# Patient Record
Sex: Female | Born: 2000 | Race: White | Hispanic: No | Marital: Single
Health system: Southern US, Community
[De-identification: ages and names within clinical notes are randomized; demographics above are authoritative.]

## PROBLEM LIST (undated history)

## (undated) DIAGNOSIS — J309 Allergic rhinitis, unspecified: Secondary | ICD-10-CM

## (undated) DIAGNOSIS — E109 Type 1 diabetes mellitus without complications: Secondary | ICD-10-CM

## (undated) DIAGNOSIS — K900 Celiac disease: Secondary | ICD-10-CM

## (undated) DIAGNOSIS — K219 Gastro-esophageal reflux disease without esophagitis: Secondary | ICD-10-CM

## (undated) DIAGNOSIS — R519 Headache, unspecified: Secondary | ICD-10-CM

## (undated) DIAGNOSIS — F909 Attention-deficit hyperactivity disorder, unspecified type: Secondary | ICD-10-CM

## (undated) DIAGNOSIS — J45909 Unspecified asthma, uncomplicated: Secondary | ICD-10-CM

## (undated) HISTORY — PX: DENTAL SURGERY: SHX609

## (undated) HISTORY — DX: Celiac disease: K90.0

## (undated) HISTORY — DX: Gastro-esophageal reflux disease without esophagitis: K21.9

## (undated) HISTORY — DX: Type 1 diabetes mellitus without complications: E10.9

## (undated) HISTORY — DX: Unspecified asthma, uncomplicated: J45.909

## (undated) HISTORY — DX: Headache, unspecified: R51.9

## (undated) HISTORY — DX: Attention-deficit hyperactivity disorder, unspecified type: F90.9

## (undated) HISTORY — DX: Allergic rhinitis, unspecified: J30.9

---
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
## 2019-09-09 ENCOUNTER — Encounter: Payer: Self-pay | Admitting: Internal Medicine

## 2019-09-12 ENCOUNTER — Ambulatory Visit: Payer: Self-pay | Admitting: Internal Medicine

## 2019-10-17 ENCOUNTER — Ambulatory Visit (INDEPENDENT_AMBULATORY_CARE_PROVIDER_SITE_OTHER): Payer: Commercial Managed Care - PPO | Admitting: Internal Medicine

## 2019-10-17 ENCOUNTER — Encounter: Payer: Self-pay | Admitting: Internal Medicine

## 2019-10-17 DIAGNOSIS — G4700 Insomnia, unspecified: Secondary | ICD-10-CM

## 2019-10-17 DIAGNOSIS — Z7689 Persons encountering health services in other specified circumstances: Secondary | ICD-10-CM | POA: Diagnosis not present

## 2019-10-17 DIAGNOSIS — J4520 Mild intermittent asthma, uncomplicated: Secondary | ICD-10-CM | POA: Diagnosis not present

## 2019-10-17 MED ORDER — TRAZODONE HCL 50 MG PO TABS: 25.0000 mg | ORAL_TABLET | Freq: Every evening | ORAL | 3 refills | Status: DC | PRN

## 2019-10-17 MED ORDER — ALBUTEROL SULFATE HFA 108 (90 BASE) MCG/ACT IN AERS: 2.0000 | INHALATION_SPRAY | RESPIRATORY_TRACT | 5 refills | Status: DC | PRN

## 2019-10-17 NOTE — Progress Notes (Signed)
Virtual Visit via Telephone Note  I connected with Samantha Bernard, on 10/17/2019 at 8:52 AM by telephone due to the COVID-19 pandemic and verified that I am speaking with the correct person using two identifiers.   Consent: I discussed the limitations, risks, security and privacy concerns of performing an evaluation and management service by telephone and the availability of in person appointments. I also discussed with the patient that there may be a patient responsible charge related to this service. The patient expressed understanding and agreed to proceed.   Location of Patient: Home   Location of Provider: Clinic    Persons participating in Telemedicine visit: Tarra Pence [facility] Dr. Juleen China      History of Present Illness: Patient has a visit to establish care. Patient has a PMH of asthma. She has been out of her Albuterol because she did not have a primary care doctor. She was previously on an inhaler that she used daily but has been 4-5 years. Otherwise, just had rescue inhaler. She has been out of Albuterol for about 1.5 months. Reports her symptoms are fine at rest and with day to day tasks. Typically, uses inhaler at most once per day. Has noticed it worsening a little bit more with the season change. No nighttime awakenings. She has had an asthma exacerbation in the past and it doesn't feel like she is having one now. Has had to be hospitalized for asthma. Has never been intubated.   Patient is also T1DM. She is managed by Jacolyn Reedy with [phone] Endocrinology.   Patient has concerns about insomnia. Not typically falling asleep until 3-4 am and if she doesn't have to wake up to go to work she will sleep until noon or 1 pm. She doesn't feel tired at night when she tries to go to bed. Asks about medicine to help with sleeping as she is starting school again the first week of May and worried about her sleep pattern.    Past Medical History:  Diagnosis Date  . ADHD    . Allergic rhinitis   . Asthma   . Celiac disease   . GERD (gastroesophageal reflux disease)   . Type 1 diabetes mellitus (HCC)    No Known Allergies  Current Outpatient Medications on File Prior to Visit  Medication Sig Dispense Refill  . albuterol (VENTOLIN HFA) 108 (90 Base) MCG/ACT inhaler Inhale 2 puffs into the lungs every 4 (four) hours as needed for wheezing or shortness of breath.    . Continuous Blood Gluc Receiver ([place]) DEVI 1 each by Misc.(Non-Drug; Combo Route) route daily.    . Continuous Blood Gluc Sensor (DEXCOM G6 SENSOR) MISC 1 each by Misc.(Non-Drug; Combo Route) route every 10 days.    . Continuous Blood Gluc Transmit (DEXCOM G6 TRANSMITTER) MISC 1 each by Misc.(Non-Drug; Combo Route) route every 3 months.    . injection device for insulin (INPEN 100-BLUE-LILLY) DEVI Use as directed to administer insulin 4 times daily    . insulin degludec (TRESIBA FLEXTOUCH) 100 UNIT/ML SOPN FlexTouch Pen Inject 55 Units into the skin daily.    . insulin lispro (HUMALOG) 100 UNIT/ML cartridge Use to inject up to 50 units daily and as needed    . norelgestromin-ethinyl estradiol [name]) 150-35 MCG/24HR transdermal patch Place 1 patch onto the skin once a week.     No current facility-administered medications on file prior to visit.    Observations/Objective: NAD. Speaking clearly.  Work of breathing normal.  Alert and oriented.  Mood appropriate.   Assessment and Plan: 1. Encounter to establish care   2. Mild intermittent asthma without complication No concern for exacerbation. Per history, does not sound like patient is using inhaler frequently enough or having symptomatology that would warrant daily control inhaler.  - albuterol (VENTOLIN HFA) 108 (90 Base) MCG/ACT inhaler; Inhale 2 puffs into the lungs every 4 (four) hours as needed for wheezing or shortness of breath.  Dispense: 18 g; Refill: 5  3. Insomnia, unspecified type Suspect related to disrupted  sleep pattern. Advised to try to wake up earlier in AM to break pattern of sleeping until mid-day. Counseled on sleep hygiene. Will trial Trazodone to help reset sleep cycle given upcoming school semester.  - traZODone (DESYREL) 50 MG tablet; Take 0.5-1 tablets (25-50 mg total) by mouth at bedtime as needed for sleep.  Dispense: 30 tablet; Refill: 3   Follow Up Instructions: PRN for above and for annual exam    I discussed the assessment and treatment plan with the patient. The patient was provided an opportunity to ask questions and all were answered. The patient agreed with the plan and demonstrated an understanding of the instructions.   The patient was advised to call back or seek an in-person evaluation if the symptoms worsen or if the condition fails to improve as anticipated.     I provided 18 minutes total of non-face-to-face time during this encounter including median intraservice time, reviewing previous notes, investigations, ordering medications, medical decision making, coordinating care and patient verbalized understanding at the end of the visit.    Phill Myron, D.O. Primary Care at [facility]  10/17/2019, 8:52 AM

## 2020-02-10 ENCOUNTER — Encounter: Payer: Self-pay | Admitting: Emergency Medicine

## 2020-02-10 ENCOUNTER — Ambulatory Visit
Admission: EM | Admit: 2020-02-10 | Discharge: 2020-02-10 | Disposition: A | Discharge status: Home / Self Care | Payer: Commercial Managed Care - PPO | Attending: Physician Assistant | Admitting: Physician Assistant

## 2020-02-10 ENCOUNTER — Other Ambulatory Visit: Payer: Self-pay

## 2020-02-10 DIAGNOSIS — R112 Nausea with vomiting, unspecified: Secondary | ICD-10-CM

## 2020-02-10 MED ORDER — ONDANSETRON 4 MG PO TBDP
4.0000 mg | ORAL_TABLET | Freq: Once | ORAL | Status: AC
  Administered 2020-02-10: 4 mg via ORAL

## 2020-02-10 NOTE — ED Notes (Signed)
Patient able to ambulate independently  

## 2020-02-10 NOTE — ED Triage Notes (Signed)
Patient presents to [facility] for assessment of nausea x 5 days, emesis 2 times since symptom onset.  Type 1 diabetic, but sugars have been stable, has monitor in arm.  Denies abdominal pain, states she has been able to eat and drink small amounts.

## 2020-02-10 NOTE — ED Provider Notes (Signed)
[facility]    CSN: [id number] Arrival date & time: 02/10/20  1507      History   Chief Complaint Chief Complaint  Patient presents with  . Nausea    HPI Samantha Bernard is a 19 y.o. female.   19 year old female with IDDM comes in for 5-day history of nausea.  2 episodes of nonbilious nonbloody vomit, has since been able to tolerate oral intake.  Denies abdominal pain, diarrhea, fever.  Denies URI symptoms.  CBG 149 at this time.      Past Medical History:  Diagnosis Date  . ADHD   . Allergic rhinitis   . Asthma   . Celiac disease   . GERD (gastroesophageal reflux disease)   . Type 1 diabetes mellitus [facility])     Patient Active Problem List   Diagnosis Date Noted  . Type 1 diabetes mellitus ([place]) 03/23/2016  . Allergic rhinitis 04/14/2011  . Asthma 04/14/2011  . Attention deficit hyperactivity disorder (ADHD) 04/14/2011  . Gastroesophageal reflux disease 04/14/2011    Past Surgical History:  Procedure Laterality Date  . DENTAL SURGERY      OB History   No obstetric history on file.      Home Medications    Prior to Admission medications   Medication Sig Start Date End Date Taking? Authorizing Provider  albuterol (VENTOLIN HFA) 108 (90 Base) MCG/ACT inhaler Inhale 2 puffs into the lungs every 4 (four) hours as needed for wheezing or shortness of breath. 10/17/19   Nicolette Bang, DO  Continuous Blood Gluc Receiver ([place]) DEVI 1 each by Misc.(Non-Drug; Combo Route) route daily. 10/05/18   [provider]  Continuous Blood Gluc Sensor (DEXCOM G6 SENSOR) MISC 1 each by Misc.(Non-Drug; Combo Route) route every 10 days. 10/05/18   [provider]  Continuous Blood Gluc Transmit (DEXCOM G6 TRANSMITTER) MISC 1 each by Misc.(Non-Drug; Combo Route) route every 3 months. 05/02/19   [provider]  injection device for insulin (INPEN 100-BLUE-LILLY) DEVI Use as directed to administer insulin 4 times daily  04/26/19   [provider]  insulin degludec (TRESIBA FLEXTOUCH) 100 UNIT/ML SOPN FlexTouch Pen Inject 55 Units into the skin daily. 08/06/19   [provider]  insulin lispro (HUMALOG) 100 UNIT/ML cartridge Use to inject up to 50 units daily and as needed 08/06/19   [provider]  norelgestromin-ethinyl estradiol [name]) 150-35 MCG/24HR transdermal patch Place 1 patch onto the skin once a week. 06/12/19   [provider]  traZODone (DESYREL) 50 MG tablet Take 0.5-1 tablets (25-50 mg total) by mouth at bedtime as needed for sleep. 10/17/19   Nicolette Bang, DO    Family History Family History  Problem Relation Age of Onset  . Diabetes Mellitus I Mother   . Thyroid disease Mother   . Depression Mother   . Asthma Mother   . Arthritis Mother   . Arthritis Maternal Grandmother   . Cancer Maternal Grandmother   . Heart disease Maternal Grandmother   . Hypertension Maternal Grandmother   . Arthritis Maternal Grandfather   . Cancer Maternal Grandfather   . Heart disease Maternal Grandfather   . Hypertension Maternal Grandfather   . Stroke Maternal Grandfather     Social History Social History   Tobacco Use  . Smoking status: Passive Smoke Exposure - Never Smoker  . Smokeless tobacco: Never Used  Substance Use Topics  . Alcohol use: Never  . Drug use: Not on file  Allergies   Patient has no known allergies.   Review of Systems Review of Systems  Reason unable to perform ROS: See HPI as above.     Physical Exam Triage Vital Signs ED Triage Vitals [02/10/20 1613]  Enc Vitals Group     BP 107/75     Pulse Rate 97     Resp 16     Temp 98.2 F (36.8 C)     Temp Source Oral     SpO2 100 %     Weight      Height      Head Circumference      Peak Flow      Pain Score      Pain Loc      Pain Edu?      Excl. in [place]?    Orthostatic VS for the past 24 hrs:  BP- Lying Pulse- Lying BP- Sitting Pulse- Sitting BP- Standing  at 0 minutes Pulse- Standing at 0 minutes  02/10/20 1637 119/76 79 114/78 92 119/81 101    Updated Vital Signs BP 107/75 (BP Location: Left Arm)   Pulse 97   Temp 98.2 F (36.8 C) (Oral)   Resp 16   LMP 02/04/2020 Comment: patch  SpO2 100%   Physical Exam Constitutional:      General: She is not in acute distress.    Appearance: She is well-developed. She is not ill-appearing, toxic-appearing or diaphoretic.  HENT:     Head: Normocephalic and atraumatic.  Eyes:     Conjunctiva/sclera: Conjunctivae normal.     Pupils: Pupils are equal, round, and reactive to light.  Cardiovascular:     Rate and Rhythm: Normal rate and regular rhythm.  Pulmonary:     Effort: Pulmonary effort is normal. No respiratory distress.     Comments: LCTAB Abdominal:     General: Bowel sounds are normal.     Palpations: Abdomen is soft.     Tenderness: There is no abdominal tenderness. There is no right CVA tenderness, left CVA tenderness, guarding or rebound.  Musculoskeletal:     Cervical back: Normal range of motion and neck supple.  Skin:    General: Skin is warm and dry.  Neurological:     Mental Status: She is alert and oriented to person, place, and time.  Psychiatric:        Behavior: Behavior normal.        Judgment: Judgment normal.      UC Treatments / Results  Labs (all labs ordered are listed, but only abnormal results are displayed) Labs Reviewed - No data to display  EKG   Radiology No results found.  Procedures Procedures (including critical care time)  Medications Ordered in UC Medications  ondansetron (ZOFRAN-ODT) disintegrating tablet 4 mg (4 mg Oral Given 02/10/20 1637)    Initial Impression / Assessment and Plan / UC Course  I have reviewed the triage vital signs and the nursing notes.  Pertinent labs & imaging results that were available during my care of the patient were reviewed by me and considered in my medical decision making (see chart for details).     Discussed with patient no alarming signs on exam. Patient with Rx of phenergan, to continue for nausea. Push fluids. Bland diet, advance as tolerated. Return precautions given.  Final Clinical Impressions(s) / UC Diagnoses   Final diagnoses:  Intractable vomiting with nausea, unspecified vomiting type    ED Prescriptions    None     PDMP  not reviewed this encounter.   Ok Edwards, PA-C 02/10/20 2020

## 2020-02-10 NOTE — Discharge Instructions (Addendum)
Your heart rate elevated slightly but no worries for significant dehydration requiring IV fluids at this time. Given blood sugar in good control, low suspicion for your diabetes causing symptoms. Can continue phenergan for now and continue to stay hydrated. You can also try some acid reflux medicine such as pepcid/omeprazole to see if it helps with symptoms. If symptoms worsens, go to the ED for further evaluation. Otherwise, follow up with PCP even if your symptoms improve for further evaluation and management needed.

## 2020-02-13 ENCOUNTER — Telehealth (INDEPENDENT_AMBULATORY_CARE_PROVIDER_SITE_OTHER): Payer: Commercial Managed Care - PPO | Admitting: Internal Medicine

## 2020-02-13 ENCOUNTER — Encounter: Payer: Self-pay | Admitting: Internal Medicine

## 2020-02-13 DIAGNOSIS — R112 Nausea with vomiting, unspecified: Secondary | ICD-10-CM | POA: Diagnosis not present

## 2020-02-13 DIAGNOSIS — G43809 Other migraine, not intractable, without status migrainosus: Secondary | ICD-10-CM

## 2020-02-13 MED ORDER — CYCLOBENZAPRINE HCL 10 MG PO TABS: 10.0000 mg | ORAL_TABLET | Freq: Three times a day (TID) | ORAL | 1 refills | Status: DC | PRN

## 2020-02-13 MED ORDER — SUMATRIPTAN SUCCINATE 50 MG PO TABS: 50.0000 mg | ORAL_TABLET | ORAL | 1 refills | Status: DC | PRN

## 2020-02-13 NOTE — Progress Notes (Signed)
Virtual Visit via Telephone Note  I connected with Samantha Bernard, on 02/13/2020 at 4:11 PM by telephone due to the COVID-19 pandemic and verified that I am speaking with the correct person using two identifiers.   Consent: I discussed the limitations, risks, security and privacy concerns of performing an evaluation and management service by telephone and the availability of in person appointments. I also discussed with the patient that there may be a patient responsible charge related to this service. The patient expressed understanding and agreed to proceed.   Location of Patient: Home   Location of Provider: Clinic    Persons participating in Telemedicine visit: Purva Vessell [facility] Dr. Juleen China   History of Present Illness: Patient has a visit to follow up on urgent care visit 7/19 for 5 days of nausea with a couple episodes of vomiting. Patient reports that nausea is almost all gone but she doesn't feel 100% back to normal. Had to take Phenergan once the day after the urgent care. Has a history of migraines and has had a migraine since yesterday. Taking Ibuprofen at home without improvement.    Past Medical History:  Diagnosis Date  . ADHD   . Allergic rhinitis   . Asthma   . Celiac disease   . GERD (gastroesophageal reflux disease)   . Type 1 diabetes mellitus (HCC)    No Known Allergies  Current Outpatient Medications on File Prior to Visit  Medication Sig Dispense Refill  . albuterol (VENTOLIN HFA) 108 (90 Base) MCG/ACT inhaler Inhale 2 puffs into the lungs every 4 (four) hours as needed for wheezing or shortness of breath. 18 g 5  . Continuous Blood Gluc Receiver ([place]) DEVI 1 each by Misc.(Non-Drug; Combo Route) route daily.    . Continuous Blood Gluc Sensor (DEXCOM G6 SENSOR) MISC 1 each by Misc.(Non-Drug; Combo Route) route every 10 days.    . Continuous Blood Gluc Transmit (DEXCOM G6 TRANSMITTER) MISC 1 each by Misc.(Non-Drug; Combo Route)  route every 3 months.    . injection device for insulin (INPEN 100-BLUE-LILLY) DEVI Use as directed to administer insulin 4 times daily    . insulin degludec (TRESIBA FLEXTOUCH) 100 UNIT/ML SOPN FlexTouch Pen Inject 55 Units into the skin daily.    . insulin lispro (HUMALOG) 100 UNIT/ML cartridge Use to inject up to 50 units daily and as needed    . norelgestromin-ethinyl estradiol [name]) 150-35 MCG/24HR transdermal patch Place 1 patch onto the skin once a week.    . traZODone (DESYREL) 50 MG tablet Take 0.5-1 tablets (25-50 mg total) by mouth at bedtime as needed for sleep. 30 tablet 3   No current facility-administered medications on file prior to visit.    Observations/Objective: NAD. Speaking clearly.  Work of breathing normal.  Alert and oriented. Mood appropriate.   Assessment and Plan: 1. Nausea and vomiting, intractability of vomiting not specified, unspecified vomiting type Improving. Continue bland diet, rest, hydration.   2. Other migraine without status migrainosus, not intractable Patient has taken muscle relaxer with improvement in headache pattern in the past but has been sedating and has concerns about taking during daytime. Will prescribe this but also Imitrex for abortive migraine therapy.  - cyclobenzaprine (FLEXERIL) 10 MG tablet; Take 1 tablet (10 mg total) by mouth 3 (three) times daily as needed.  Dispense: 30 tablet; Refill: 1 - SUMAtriptan (IMITREX) 50 MG tablet; Take 1 tablet (50 mg total) by mouth every 2 (two) hours as needed for migraine. May repeat in  2 hours if headache persists or recurs.  Dispense: 10 tablet; Refill: 1   Follow Up Instructions: PRN and for routine medical care    I discussed the assessment and treatment plan with the patient. The patient was provided an opportunity to ask questions and all were answered. The patient agreed with the plan and demonstrated an understanding of the instructions.   The patient was advised to call back or  seek an in-person evaluation if the symptoms worsen or if the condition fails to improve as anticipated.     I provided 20 minutes total of non-face-to-face time during this encounter including median intraservice time, reviewing previous notes, investigations, ordering medications, medical decision making, coordinating care and patient verbalized understanding at the end of the visit.    Phill Myron, D.O. Primary Care at [facility]  02/13/2020, 4:11 PM

## 2020-02-25 ENCOUNTER — Other Ambulatory Visit: Payer: Self-pay

## 2020-02-25 DIAGNOSIS — G4700 Insomnia, unspecified: Secondary | ICD-10-CM

## 2020-02-25 MED ORDER — TRAZODONE HCL 50 MG PO TABS: 25.0000 mg | ORAL_TABLET | Freq: Every evening | ORAL | 0 refills | Status: DC | PRN

## 2020-03-12 ENCOUNTER — Encounter: Payer: Self-pay | Admitting: Internal Medicine

## 2020-03-12 ENCOUNTER — Telehealth (INDEPENDENT_AMBULATORY_CARE_PROVIDER_SITE_OTHER): Payer: Commercial Managed Care - PPO | Admitting: Internal Medicine

## 2020-03-12 DIAGNOSIS — R112 Nausea with vomiting, unspecified: Secondary | ICD-10-CM | POA: Diagnosis not present

## 2020-03-12 DIAGNOSIS — R1084 Generalized abdominal pain: Secondary | ICD-10-CM

## 2020-03-12 MED ORDER — PANTOPRAZOLE SODIUM 40 MG PO TBEC: 40.0000 mg | DELAYED_RELEASE_TABLET | Freq: Every day | ORAL | 1 refills | Status: DC

## 2020-03-12 MED ORDER — ONDANSETRON HCL 4 MG PO TABS: 4.0000 mg | ORAL_TABLET | Freq: Three times a day (TID) | ORAL | 1 refills | Status: AC | PRN

## 2020-03-12 NOTE — Progress Notes (Signed)
Virtual Visit via Telephone Note  I connected with Samantha Bernard, on 03/12/2020 at 9:01 AM by telephone due to the COVID-19 pandemic and verified that I am speaking with the correct person using two identifiers.   Consent: I discussed the limitations, risks, security and privacy concerns of performing an evaluation and management service by telephone and the availability of in person appointments. I also discussed with the patient that there may be a patient responsible charge related to this service. The patient expressed understanding and agreed to proceed.   Location of Patient: Home   Location of Provider: Clinic    Persons participating in Telemedicine visit: Aviyah Swetz [facility] Dr. Juleen China      History of Present Illness: Patient has a visit to follow up on nausea, vomiting, abdominal pain. She was seen in Urgent Care for same complaint on 7/19. Had televisit 7/22 and was improving; thought to be related to acute gastroenteritis and dehydration. Started again on Tuesday this week and had to leave school/work due to it. Had a prior episode between our last visit and the one this week but wasn't as severe. Abdominal pain is generalized and not localized. She is feeling nauseous but has not been able to actually vomit. She has a history of T1DM but CBGs have not been low or high. Also has a history of migraines but reports this does not feel related. Notices that sometimes it happens after eating and sometimes happens before eating and will try to eat to improve symptoms but won't help. Denies burning epigastric pain. Does feel like sometimes she has to burp but will throw up if she does. No diarrhea or constipation. No blood in stool. No THC use. Reports that she was diagnosed with celiac disease a long time ago but she tried to go back on gluten free diet and didn't change her symptoms.    Past Medical History:  Diagnosis Date  . ADHD   . Allergic rhinitis   . Asthma    . Celiac disease   . GERD (gastroesophageal reflux disease)   . Type 1 diabetes mellitus (HCC)    No Known Allergies  Current Outpatient Medications on File Prior to Visit  Medication Sig Dispense Refill  . albuterol (VENTOLIN HFA) 108 (90 Base) MCG/ACT inhaler Inhale 2 puffs into the lungs every 4 (four) hours as needed for wheezing or shortness of breath. 18 g 5  . Continuous Blood Gluc Receiver ([place]) DEVI 1 each by Misc.(Non-Drug; Combo Route) route daily.    . Continuous Blood Gluc Sensor (DEXCOM G6 SENSOR) MISC 1 each by Misc.(Non-Drug; Combo Route) route every 10 days.    . Continuous Blood Gluc Transmit (DEXCOM G6 TRANSMITTER) MISC 1 each by Misc.(Non-Drug; Combo Route) route every 3 months.    . cyclobenzaprine (FLEXERIL) 10 MG tablet Take 1 tablet (10 mg total) by mouth 3 (three) times daily as needed. 30 tablet 1  . injection device for insulin (INPEN 100-BLUE-LILLY) DEVI Use as directed to administer insulin 4 times daily    . insulin degludec (TRESIBA FLEXTOUCH) 100 UNIT/ML SOPN FlexTouch Pen Inject 55 Units into the skin daily.    . insulin lispro (HUMALOG) 100 UNIT/ML cartridge Use to inject up to 50 units daily and as needed    . norelgestromin-ethinyl estradiol [name]) 150-35 MCG/24HR transdermal patch Place 1 patch onto the skin once a week.    . SUMAtriptan (IMITREX) 50 MG tablet Take 1 tablet (50 mg total) by mouth every 2 (two)  hours as needed for migraine. May repeat in 2 hours if headache persists or recurs. 10 tablet 1  . traZODone (DESYREL) 50 MG tablet Take 0.5-1 tablets (25-50 mg total) by mouth at bedtime as needed for sleep. 90 tablet 0   No current facility-administered medications on file prior to visit.    Observations/Objective: NAD. Speaking clearly.  Work of breathing normal.  Alert and oriented. Mood appropriate.   Assessment and Plan: 1. Generalized abdominal pain 2. Nausea and vomiting, intractability of vomiting not specified,  unspecified vomiting type Chronic and recurrent in nature. Patient does report h/o celiac disease. Would advise gluten avoidance. Will trial PPI to see if symptoms may be related to uncontrolled reflux. Will also refer to GI for further work up.  - pantoprazole (PROTONIX) 40 MG tablet; Take 1 tablet (40 mg total) by mouth daily.  Dispense: 30 tablet; Refill: 1 - Ambulatory referral to Gastroenterology - ondansetron (ZOFRAN) 4 MG tablet; Take 1 tablet (4 mg total) by mouth every 8 (eight) hours as needed for nausea or vomiting.  Dispense: 30 tablet; Refill: 1     Follow Up Instructions: GI follow up and with PCP for routine medical care    I discussed the assessment and treatment plan with the patient. The patient was provided an opportunity to ask questions and all were answered. The patient agreed with the plan and demonstrated an understanding of the instructions.   The patient was advised to call back or seek an in-person evaluation if the symptoms worsen or if the condition fails to improve as anticipated.     I provided 18 minutes total of non-face-to-face time during this encounter including median intraservice time, reviewing previous notes, investigations, ordering medications, medical decision making, coordinating care and patient verbalized understanding at the end of the visit.    Phill Myron, D.O. Primary Care at [facility]  03/12/2020, 9:01 AM

## 2020-04-03 ENCOUNTER — Other Ambulatory Visit: Payer: Self-pay

## 2020-04-03 DIAGNOSIS — R112 Nausea with vomiting, unspecified: Secondary | ICD-10-CM

## 2020-04-03 DIAGNOSIS — R1084 Generalized abdominal pain: Secondary | ICD-10-CM

## 2020-04-03 MED ORDER — PANTOPRAZOLE SODIUM 40 MG PO TBEC: 40.0000 mg | DELAYED_RELEASE_TABLET | Freq: Every day | ORAL | 0 refills | Status: AC

## 2020-04-15 ENCOUNTER — Telehealth (INDEPENDENT_AMBULATORY_CARE_PROVIDER_SITE_OTHER): Payer: Commercial Managed Care - PPO | Admitting: Family Medicine

## 2020-04-15 DIAGNOSIS — R519 Headache, unspecified: Secondary | ICD-10-CM

## 2020-04-15 DIAGNOSIS — Z789 Other specified health status: Secondary | ICD-10-CM | POA: Diagnosis not present

## 2020-04-15 MED ORDER — METOCLOPRAMIDE HCL 10 MG PO TABS: 10.0000 mg | ORAL_TABLET | Freq: Three times a day (TID) | ORAL | 1 refills | Status: AC | PRN

## 2020-04-15 MED ORDER — TIZANIDINE HCL 2 MG PO CAPS: 2.0000 mg | ORAL_CAPSULE | Freq: Three times a day (TID) | ORAL | 1 refills | Status: AC | PRN

## 2020-04-15 NOTE — Progress Notes (Signed)
Virtual Visit via Telephone Note  I connected with Samantha Bernard on 04/15/20 at  3:10 PM EDT by telephone and verified that I am speaking with the correct person using two identifiers.  Location: Patient: Home  Provider: Office    I discussed the limitations, risks, security and privacy concerns of performing an evaluation and management service by telephone and the availability of in person appointments. I also discussed with the patient that there may be a patient responsible charge related to this service. The patient expressed understanding and agreed to proceed.   History of Present Illness: Samantha Bernard, 19 y.o female, with Type 1 Diabetes, GERD, ADHD, and asthma, is present via today's telemedicine encounter for medication management for recurrent migrainous type headaches. Recently prescribed Imitrex and reports medication caused the sensation of dysphagia and worsened the intensity of headaches. She has taken medication twice and experienced the same effect. She has been frequently dosing ibuprofen for management of current HA without relief. She has had migraine since premenarche and previously prescribed triptan therapy without improvement of HA.  Denies any vision changes. Location of headache changes. Uncertain of any specific trigger. No prior evaluation by neurology.   Observations/Objective: Speaking in clear sentences. Breathing pattern is audibly normal.  Patient is asking and responding to questions appropriately.  Assessment and Plan: 1. Recurrent headache, discussed if treatment prescribed today fails or headaches increase in frequency, recommend evaluation by by HA clinic or neurology -Trial Tizanidine and Reglan at the onset of HA . Take as prescribed. -Avoid recurrent Ibuprofen can cause recurrent rebound HA -Follow-up if no improvement   2. Medication intolerance -Imitrex intolerance   Follow Up Instructions: -Follow-up if HA doesn't improve    I discussed the  assessment and treatment plan with the patient. The patient was provided an opportunity to ask questions and all were answered. The patient agreed with the plan and demonstrated an understanding of the instructions.   The patient was advised to call back or seek an in-person evaluation if the symptoms worsen or if the condition fails to improve as anticipated.  I provided 30 minutes of non-face-to-face time during this encounter.    Molli Barrows, FNP-C Primary Care at [facility] [address], [place] [phone]: [phone]

## 2020-04-16 ENCOUNTER — Ambulatory Visit
Admission: EM | Admit: 2020-04-16 | Discharge: 2020-04-16 | Discharge status: Against Medical Advice | Payer: Commercial Managed Care - PPO

## 2020-04-16 ENCOUNTER — Other Ambulatory Visit: Payer: Self-pay

## 2020-04-16 NOTE — ED Notes (Signed)
Called pt for triage, no answer, voice message left.

## 2020-04-16 NOTE — ED Notes (Signed)
Attempt to call x2

## 2020-04-24 ENCOUNTER — Emergency Department (HOSPITAL_COMMUNITY)
Admission: EM | Admit: 2020-04-24 | Discharge: 2020-04-25 | Disposition: A | Discharge status: Home / Self Care | Payer: Commercial Managed Care - PPO | Attending: Emergency Medicine | Admitting: Emergency Medicine

## 2020-04-24 ENCOUNTER — Other Ambulatory Visit: Payer: Self-pay

## 2020-04-24 DIAGNOSIS — G43909 Migraine, unspecified, not intractable, without status migrainosus: Secondary | ICD-10-CM | POA: Insufficient documentation

## 2020-04-24 DIAGNOSIS — Z5321 Procedure and treatment not carried out due to patient leaving prior to being seen by health care provider: Secondary | ICD-10-CM | POA: Insufficient documentation

## 2020-04-24 NOTE — ED Notes (Signed)
Pt called x3. No answer

## 2020-04-25 ENCOUNTER — Other Ambulatory Visit: Payer: Self-pay

## 2020-04-25 ENCOUNTER — Encounter: Payer: Self-pay | Admitting: Emergency Medicine

## 2020-04-25 ENCOUNTER — Encounter (HOSPITAL_COMMUNITY): Payer: Self-pay | Admitting: Emergency Medicine

## 2020-04-25 ENCOUNTER — Ambulatory Visit
Admission: EM | Admit: 2020-04-25 | Discharge: 2020-04-25 | Disposition: A | Discharge status: Home / Self Care | Payer: Commercial Managed Care - PPO | Attending: Family Medicine | Admitting: Family Medicine

## 2020-04-25 DIAGNOSIS — R519 Headache, unspecified: Secondary | ICD-10-CM

## 2020-04-25 MED ORDER — BUTALBITAL-APAP-CAFFEINE 50-325-40 MG PO TABS: 1.0000 | ORAL_TABLET | Freq: Four times a day (QID) | ORAL | 0 refills | Status: AC | PRN

## 2020-04-25 MED ORDER — KETOROLAC TROMETHAMINE 30 MG/ML IJ SOLN
30.0000 mg | Freq: Once | INTRAMUSCULAR | Status: AC
  Administered 2020-04-25: 30 mg via INTRAMUSCULAR

## 2020-04-25 MED ORDER — METOCLOPRAMIDE HCL 5 MG/ML IJ SOLN
5.0000 mg | Freq: Once | INTRAMUSCULAR | Status: AC
  Administered 2020-04-25: 5 mg via INTRAMUSCULAR

## 2020-04-25 MED ORDER — DEXAMETHASONE SODIUM PHOSPHATE 10 MG/ML IJ SOLN
10.0000 mg | Freq: Once | INTRAMUSCULAR | Status: AC
  Administered 2020-04-25: 10 mg via INTRAMUSCULAR

## 2020-04-25 NOTE — ED Provider Notes (Signed)
[facility]    CSN: [id number] Arrival date & time: 04/25/20  1508      History   Chief Complaint No chief complaint on file.   HPI Samantha Bernard is a 19 y.o. female.   HPI  Patient presents with recurrent migrainous type headaches.  Patient had a phone visit with provider over a week and half ago in which she was prescribed Reglan along with tizanidine.  Today she reports the medication did not help.  She subsequently went to another urgent care and had some type of a medicine but she cannot recall the name prescribed however was unable to get medication filled due to prior authorization was not completed.  Patient has not had formal evaluation by neurology however is open to a referral.  Denies any photophobia has, dizziness or new weakness.  Patient is scheduled to see an ophthalmologist within the next few weeks.  Past Medical History:  Diagnosis Date  . ADHD   . Allergic rhinitis   . Asthma   . Celiac disease   . GERD (gastroesophageal reflux disease)   . Type 1 diabetes mellitus [facility])     Patient Active Problem List   Diagnosis Date Noted  . Type 1 diabetes mellitus ([place]) 03/23/2016  . Allergic rhinitis 04/14/2011  . Asthma 04/14/2011  . Attention deficit hyperactivity disorder (ADHD) 04/14/2011  . Gastroesophageal reflux disease 04/14/2011    Past Surgical History:  Procedure Laterality Date  . DENTAL SURGERY      OB History   No obstetric history on file.      Home Medications    Prior to Admission medications   Medication Sig Start Date End Date Taking? Authorizing Provider  albuterol (VENTOLIN HFA) 108 (90 Base) MCG/ACT inhaler Inhale 2 puffs into the lungs every 4 (four) hours as needed for wheezing or shortness of breath. 10/17/19   Nicolette Bang, DO  Continuous Blood Gluc Receiver ([place]) DEVI 1 each by Misc.(Non-Drug; Combo Route) route daily. 10/05/18   [provider]  Continuous Blood Gluc Sensor  (DEXCOM G6 SENSOR) MISC 1 each by Misc.(Non-Drug; Combo Route) route every 10 days. 10/05/18   [provider]  Continuous Blood Gluc Transmit (DEXCOM G6 TRANSMITTER) MISC 1 each by Misc.(Non-Drug; Combo Route) route every 3 months. 05/02/19   [provider]  injection device for insulin (INPEN 100-BLUE-LILLY) DEVI Use as directed to administer insulin 4 times daily 04/26/19   [provider]  insulin degludec (TRESIBA FLEXTOUCH) 100 UNIT/ML FlexTouch Pen Inject 43 Units into the skin daily. 04/06/20   [provider]  insulin lispro (HUMALOG) 100 UNIT/ML cartridge Use to inject up to 50 units daily and as needed 08/06/19   [provider]  metoCLOPramide (REGLAN) 10 MG tablet Take 1 tablet (10 mg total) by mouth every 8 (eight) hours as needed (Recurrent migraine HA). 04/15/20   Scot Jun, FNP  norelgestromin-ethinyl estradiol [name]) 150-35 MCG/24HR transdermal patch Place 1 patch onto the skin once a week. 06/12/19   [provider]  ondansetron (ZOFRAN) 4 MG tablet Take 1 tablet (4 mg total) by mouth every 8 (eight) hours as needed for nausea or vomiting. 03/12/20   Nicolette Bang, DO  pantoprazole (PROTONIX) 40 MG tablet Take 1 tablet (40 mg total) by mouth daily. 04/03/20   Nicolette Bang, DO  tizanidine (ZANAFLEX) 2 MG capsule Take 1-2 capsules (2-4 mg total) by mouth 3 (three) times daily as needed (Recurrent [place] HA). 04/15/20  Scot Jun, FNP  traZODone (DESYREL) 50 MG tablet Take 0.5-1 tablets (25-50 mg total) by mouth at bedtime as needed for sleep. 02/25/20   Nicolette Bang, DO    Family History Family History  Problem Relation Age of Onset  . Diabetes Mellitus I Mother   . Thyroid disease Mother   . Depression Mother   . Asthma Mother   . Arthritis Mother   . Arthritis Maternal Grandmother   . Cancer Maternal Grandmother   . Heart disease Maternal Grandmother   . Hypertension  Maternal Grandmother   . Arthritis Maternal Grandfather   . Cancer Maternal Grandfather   . Heart disease Maternal Grandfather   . Hypertension Maternal Grandfather   . Stroke Maternal Grandfather     Social History Social History   Tobacco Use  . Smoking status: Passive Smoke Exposure - Never Smoker  . Smokeless tobacco: Never Used  Substance Use Topics  . Alcohol use: Never  . Drug use: Never     Allergies   Imitrex [sumatriptan]   Review of Systems Review of Systems Pertinent negatives listed in HPI   Physical Exam Triage Vital Signs No data found.  Updated Vital Signs LMP 04/25/2020   Visual Acuity Right Eye Distance:   Left Eye Distance:   Bilateral Distance:    Right Eye Near:   Left Eye Near:    Bilateral Near:     Physical Exam Constitutional:      Comments: Thin appearance.  HENT:     Head: Normocephalic.  Cardiovascular:     Rate and Rhythm: Tachycardia present.  Pulmonary:     Effort: Pulmonary effort is normal.     Breath sounds: Normal breath sounds.  Neurological:     Mental Status: She is alert.     Coordination: Coordination is intact.     Gait: Gait is intact.  Psychiatric:        Attention and Perception: Attention normal.        Mood and Affect: Mood normal.        Speech: Speech normal.        Cognition and Memory: Cognition normal.      UC Treatments / Results  Labs (all labs ordered are listed, but only abnormal results are displayed) Labs Reviewed - No data to display  EKG   Radiology No results found.  Procedures Procedures (including critical care time)  Medications Ordered in UC Medications  ketorolac (TORADOL) 30 MG/ML injection 30 mg (30 mg Intramuscular Given 04/25/20 1615)  metoCLOPramide (REGLAN) injection 5 mg (5 mg Intramuscular Given 04/25/20 1616)  dexamethasone (DECADRON) injection 10 mg (10 mg Intramuscular Given 04/25/20 1616)    Initial Impression / Assessment and Plan / UC Course  I have  reviewed the triage vital signs and the nursing notes.  Pertinent labs & imaging results that were available during my care of the patient were reviewed by me and considered in my medical decision making (see chart for details).   Patient with a history of recurrent migrainous type headaches with an intolerance to Imitrex.  Headache cocktail administered here in clinic today.  A referral has been placed to neurology for further evaluation of chronic recurrent headaches.  Will trial Fioricet with restrictions not to use greater than 72 hours consecutively for headache management to prevent rebound headache.  Patient verbalized understanding and agreed with plan.  Final Clinical Impressions(s) / UC Diagnoses   Final diagnoses:  Recurrent headache   Discharge Instructions  None    ED Prescriptions    Medication Sig Dispense Auth. Provider   butalbital-acetaminophen-caffeine (FIORICET) 50-325-40 MG tablet Take 1-2 tablets by mouth every 6 (six) hours as needed for headache. 20 tablet Scot Jun, FNP     PDMP not reviewed this encounter.   Scot Jun, [place] 04/29/20 [phone]

## 2020-04-25 NOTE — ED Triage Notes (Signed)
Pt here for HA with hx of same; pt sts worse last night and today

## 2020-04-25 NOTE — ED Notes (Signed)
Pt didn't answer when called to vitals

## 2020-04-25 NOTE — ED Triage Notes (Signed)
Pt c/o HA x 1.5 weeks, pt seen for same by PCP but was unable to get Tizanidine and Reglan d/t needing prior auth.  Pt denies photo and phono sensitivity. Denies n/v.

## 2020-04-25 NOTE — ED Notes (Signed)
Pt didn't answer when called x2

## 2020-04-30 ENCOUNTER — Other Ambulatory Visit: Payer: Self-pay

## 2020-04-30 ENCOUNTER — Ambulatory Visit (INDEPENDENT_AMBULATORY_CARE_PROVIDER_SITE_OTHER): Payer: Commercial Managed Care - PPO | Admitting: Neurology

## 2020-04-30 ENCOUNTER — Encounter: Payer: Self-pay | Admitting: Neurology

## 2020-04-30 VITALS — BP 120/81 | HR 110 | Ht 62.0 in | Wt 114.5 lb

## 2020-04-30 DIAGNOSIS — G43709 Chronic migraine without aura, not intractable, without status migrainosus: Secondary | ICD-10-CM

## 2020-04-30 MED ORDER — NORTRIPTYLINE HCL 10 MG PO CAPS: 20.0000 mg | ORAL_CAPSULE | Freq: Every day | ORAL | 11 refills | Status: DC

## 2020-04-30 NOTE — Patient Instructions (Signed)
ubrevyl 54m qhs for headache  May mix with [place] Phenergan Benadryl Tizanidine

## 2020-04-30 NOTE — Progress Notes (Signed)
Chief Complaint  Patient presents with  . New Patient (Initial Visit)    She normally gets 1-2 headaches per month. For the last three weeks, she has had a daily headache. She was seen at urgent care on 04/25/20 and given a migraine cocktail that only lasted for one day. Fioricet provided temporary relief but causes drowsiness. She is not able to take the medication while she is in school during the day. She had severe reaction to sumatriptan. She has rx for [name] but has not picked it up yet.   Marland Kitchen PCP    Nicolette Bang, DO (referred by urgent care)    HISTORICAL  Samantha Bernard is a 19 years old female, seen in request by her primary care physician Dr. Juleen China, [facility] for evaluation of chronic migraine headaches, initial evaluation was on April 30, 2020  I reviewed and summarized the referring note.  Past medical history Type 1 diabetes, Celiac disease Depression  She reported a history of migraine headaches since 19 years old, used to happen once or twice each months, but had a persistent headache since mid September 2021, going on third week, over the past 3 weeks, she has tried Fioricet, tizanidine, Zofran, without helping her symptoms, previously tried Imitrex complains of itchy throat, make her headache worse, does not want to try another triptan again  She describes her headache as lateralized pressure headaches, few times a week it would exacerbated to a much more severe pounding headache, with light noise sensitivity, movement made it worse, sleep usually help  She was giving a prescription of Ubrelvy, but has not tried it yet  She denies lateralized motor or sensory deficit,  REVIEW OF SYSTEMS: Full 14 system review of systems performed and notable only for as above All other review of systems were negative.  ALLERGIES: Allergies  Allergen Reactions  . Imitrex [Sumatriptan]     Inability to swallow and sensation of throat swallowing    HOME  MEDICATIONS: Current Outpatient Medications  Medication Sig Dispense Refill  . albuterol (VENTOLIN HFA) 108 (90 Base) MCG/ACT inhaler Inhale 2 puffs into the lungs every 4 (four) hours as needed for wheezing or shortness of breath. 18 g 5  . butalbital-acetaminophen-caffeine (FIORICET) 50-325-40 MG tablet Take 1-2 tablets by mouth every 6 (six) hours as needed for headache. 20 tablet 0  . Continuous Blood Gluc Receiver ([place]) DEVI 1 each by Misc.(Non-Drug; Combo Route) route daily.    . Continuous Blood Gluc Sensor (DEXCOM G6 SENSOR) MISC 1 each by Misc.(Non-Drug; Combo Route) route every 10 days.    . Continuous Blood Gluc Transmit (DEXCOM G6 TRANSMITTER) MISC 1 each by Misc.(Non-Drug; Combo Route) route every 3 months.    . injection device for insulin (INPEN 100-BLUE-LILLY) DEVI Use as directed to administer insulin 4 times daily    . insulin degludec (TRESIBA FLEXTOUCH) 100 UNIT/ML FlexTouch Pen Inject 43 Units into the skin daily.    . insulin lispro (HUMALOG) 100 UNIT/ML cartridge Use to inject up to 50 units daily and as needed    . metoCLOPramide (REGLAN) 10 MG tablet Take 1 tablet (10 mg total) by mouth every 8 (eight) hours as needed (Recurrent migraine HA). 60 tablet 1  . norelgestromin-ethinyl estradiol [name]) 150-35 MCG/24HR transdermal patch Place 1 patch onto the skin once a week.    . ondansetron (ZOFRAN) 4 MG tablet Take 1 tablet (4 mg total) by mouth every 8 (eight) hours as needed for nausea or vomiting. 30 tablet  1  . pantoprazole (PROTONIX) 40 MG tablet Take 1 tablet (40 mg total) by mouth daily. 90 tablet 0  . tizanidine (ZANAFLEX) 2 MG capsule Take 1-2 capsules (2-4 mg total) by mouth 3 (three) times daily as needed (Recurrent Mirgraine HA). 60 capsule 1  . traZODone (DESYREL) 50 MG tablet Take 0.5-1 tablets (25-50 mg total) by mouth at bedtime as needed for sleep. 90 tablet 0  . UBRELVY 50 MG TABS Take 1 tablet by mouth as directed. #10/30. Take 1 tab at onset  of migraine.  May repeat in 2 hrs, if needed.  Max dose: 2 tabs/day. This is a 30 day prescription.     No current facility-administered medications for this visit.    PAST MEDICAL HISTORY: Past Medical History:  Diagnosis Date  . ADHD   . Allergic rhinitis   . Asthma   . Celiac disease   . GERD (gastroesophageal reflux disease)   . Headache   . Type 1 diabetes mellitus ([place])     PAST SURGICAL HISTORY: Past Surgical History:  Procedure Laterality Date  . DENTAL SURGERY      FAMILY HISTORY: Family History  Problem Relation Age of Onset  . Diabetes Mellitus I Mother   . Thyroid disease Mother   . Depression Mother   . Asthma Mother   . Arthritis Mother   . Arthritis Maternal Grandmother   . Cancer Maternal Grandmother   . Heart disease Maternal Grandmother   . Hypertension Maternal Grandmother   . Arthritis Maternal Grandfather   . Cancer Maternal Grandfather   . Heart disease Maternal Grandfather   . Hypertension Maternal Grandfather   . Stroke Maternal Grandfather   . Hypertension Father     SOCIAL HISTORY: Social History   Socioeconomic History  . Marital status: Single    Spouse name: Not on file  . Number of children: 0  . Years of education: 04/30/20 - current college student  . Highest education level: Not on file  Occupational History  . Occupation: Electronics engineer  Tobacco Use  . Smoking status: Passive Smoke Exposure - Never Smoker  . Smokeless tobacco: Never Used  Substance and Sexual Activity  . Alcohol use: Never  . Drug use: Never  . Sexual activity: Not on file  Other Topics Concern  . Not on file  Social History Narrative   Lives at home with her mother.   Right-handed.   No daily use of caffeine.   Social Determinants of Health   Financial Resource Strain:   . Difficulty of Paying Living Expenses: Not on file  Food Insecurity:   . Worried About Charity fundraiser in the Last Year: Not on file  . Ran Out of Food in the Last Year:  Not on file  Transportation Needs:   . Lack of Transportation (Medical): Not on file  . Lack of Transportation (Non-Medical): Not on file  Physical Activity:   . Days of Exercise per Week: Not on file  . Minutes of Exercise per Session: Not on file  Stress:   . Feeling of Stress : Not on file  Social Connections:   . Frequency of Communication with Friends and Family: Not on file  . Frequency of Social Gatherings with Friends and Family: Not on file  . Attends Religious Services: Not on file  . Active Member of Clubs or Organizations: Not on file  . Attends Archivist Meetings: Not on file  . Marital Status: Not on file  Intimate Partner Violence:   . Fear of Current or Ex-Partner: Not on file  . Emotionally Abused: Not on file  . Physically Abused: Not on file  . Sexually Abused: Not on file     PHYSICAL EXAM   Vitals:   04/30/20 1340  BP: 120/81  Pulse: (!) 110  Weight: 114 lb 8 oz (51.9 kg)  Height: 5' 2"  (1.575 m)   Not recorded     Body mass index is 20.94 kg/m.  PHYSICAL EXAMNIATION:  Gen: NAD, conversant, well nourised, well groomed                     Cardiovascular: Regular rate rhythm, no peripheral edema, warm, nontender. Eyes: Conjunctivae clear without exudates or hemorrhage Neck: Supple, no carotid bruits. Pulmonary: Clear to auscultation bilaterally   NEUROLOGICAL EXAM:  MENTAL STATUS: Speech:    Speech is normal; fluent and spontaneous with normal comprehension.  Cognition:     Orientation to time, place and person     Normal recent and remote memory     Normal Attention span and concentration     Normal Language, naming, repeating,spontaneous speech     Fund of knowledge   CRANIAL NERVES: CN II: Visual fields are full to confrontation. Pupils are round equal and briskly reactive to light. CN III, IV, VI: extraocular movement are normal. No ptosis. CN V: Facial sensation is intact to light touch CN VII: Face is symmetric with  normal eye closure  CN VIII: Hearing is normal to causal conversation. CN IX, X: Phonation is normal. CN XI: Head turning and shoulder shrug are intact  MOTOR: There is no pronator drift of out-stretched arms. Muscle bulk and tone are normal. Muscle strength is normal.  REFLEXES: Reflexes are 2+ and symmetric at the biceps, triceps, knees, and ankles. Plantar responses are flexor.  SENSORY: Intact to light touch, pinprick and vibratory sensation are intact in fingers and toes.  COORDINATION: There is no trunk or limb dysmetria noted.  GAIT/STANCE: Posture is normal. Gait is steady with normal steps, base, arm swing, and turning. Heel and toe walking are normal. Tandem gait is normal.  Romberg is absent.   DIAGNOSTIC DATA (LABS, IMAGING, TESTING) - I reviewed patient records, labs, notes, testing and imaging myself where available.   ASSESSMENT AND PLAN  Samantha Bernard is a 19 y.o. female   Chronic migraine headaches  Starting preventive medication nortriptyline, 10 mg titrating to 20 mg every night  Ubrelvy as needed  May combine it with Aleve, tizanidine, Benadryl for more prolonged severe headaches   Marcial Pacas, M.D. Ph.D.  [facility] [address] [place], [place] [address] Ph: [phone] Fax: [phone]  CC:  Nicolette Bang, DO [address] [place],  [place] [address]

## 2020-05-23 ENCOUNTER — Other Ambulatory Visit: Payer: Self-pay | Admitting: Neurology

## 2020-07-13 ENCOUNTER — Other Ambulatory Visit: Payer: Self-pay

## 2020-07-13 ENCOUNTER — Ambulatory Visit (INDEPENDENT_AMBULATORY_CARE_PROVIDER_SITE_OTHER): Payer: Commercial Managed Care - PPO | Admitting: Neurology

## 2020-07-13 ENCOUNTER — Encounter: Payer: Self-pay | Admitting: Neurology

## 2020-07-13 VITALS — BP 138/93 | HR 117 | Ht 62.0 in | Wt 118.0 lb

## 2020-07-13 DIAGNOSIS — G43709 Chronic migraine without aura, not intractable, without status migrainosus: Secondary | ICD-10-CM

## 2020-07-13 MED ORDER — NORTRIPTYLINE HCL 25 MG PO CAPS: 50.0000 mg | ORAL_CAPSULE | Freq: Every day | ORAL | 11 refills | Status: DC

## 2020-07-13 MED ORDER — UBRELVY 50 MG PO TABS: 1.0000 | ORAL_TABLET | ORAL | 11 refills | Status: AC

## 2020-07-13 NOTE — Progress Notes (Signed)
Chief Complaint  Patient presents with  . Migraine    She does not feel nortriptyline 68m QHS has reduced the frequency of her migrianes. Samantha Bernard working well as her rescue medication.    HISTORICAL  TDelsy Bernard a 19years old female, seen in request by her primary care physician Dr. WJuleen China CBayard Beaverfor evaluation of chronic migraine headaches, initial evaluation was on April 30, 2020  I reviewed and summarized the referring note.  Past medical history Type 1 diabetes, Celiac disease Depression  She reported a history of migraine headaches since 19years old, used to happen once or twice each months, but had a persistent headache since mid September 2021, going on third week, over the past 3 weeks, she has tried Fioricet, tizanidine, Zofran, without helping her symptoms, previously tried Imitrex complains of itchy throat, make her headache worse, does not want to try another triptan again  She describes her headache as lateralized pressure headaches, few times a week it would exacerbated to a much more severe pounding headache, with light noise sensitivity, movement made it worse, sleep usually help  She was giving a prescription of Samantha Meier but has not tried it yet  She denies lateralized motor or sensory deficit,  UPDATE Jul 13 2020: She is overall doing well, ubrevyl is effective as preventive medications, will help migraine in 1 hour, she is taking nortriptyline 10 mg 2 tablets every night, tolerating the medication well, no side effects, she reported mild to moderately decreased frequency of migraine,  REVIEW OF SYSTEMS: Full 14 system review of systems performed and notable only for as above All other review of systems were negative.  ALLERGIES: Allergies  Allergen Reactions  . Imitrex [Sumatriptan]     Inability to swallow and sensation of throat swallowing    HOME MEDICATIONS: Current Outpatient Medications  Medication Sig Dispense Refill  .  albuterol (VENTOLIN HFA) 108 (90 Base) MCG/ACT inhaler Inhale 2 puffs into the lungs every 4 (four) hours as needed for wheezing or shortness of breath. 18 g 5  . butalbital-acetaminophen-caffeine (FIORICET) 50-325-40 MG tablet Take 1-2 tablets by mouth every 6 (six) hours as needed for headache. 20 tablet 0  . Continuous Blood Gluc Receiver (Samantha Bernard 1 each by Misc.(Non-Drug; Combo Route) route daily.    . Continuous Blood Gluc Sensor (DEXCOM G6 SENSOR) MISC 1 each by Misc.(Non-Drug; Combo Route) route every 10 days.    . Continuous Blood Gluc Transmit (DEXCOM G6 TRANSMITTER) MISC 1 each by Misc.(Non-Drug; Combo Route) route every 3 months.    . injection device for insulin (INPEN 100-BLUE-LILLY) Bernard Use as directed to administer insulin 4 times daily    . insulin degludec (TRESIBA FLEXTOUCH) 100 UNIT/ML FlexTouch Pen Inject 43 Units into the skin daily.    . insulin lispro (HUMALOG) 100 UNIT/ML cartridge Use to inject up to 50 units daily and as needed    . metoCLOPramide (REGLAN) 10 MG tablet Take 1 tablet (10 mg total) by mouth every 8 (eight) hours as needed (Recurrent migraine HA). 60 tablet 1  . norelgestromin-ethinyl estradiol (Samantha Bernard 150-35 MCG/24HR transdermal patch Place 1 patch onto the skin once a week.    . nortriptyline (PAMELOR) 10 MG capsule TAKE 2 CAPSULES BY MOUTH AT BEDTIME. 180 capsule 3  . ondansetron (ZOFRAN) 4 MG tablet Take 1 tablet (4 mg total) by mouth every 8 (eight) hours as needed for nausea or vomiting. 30 tablet 1  . pantoprazole (PROTONIX) 40 MG tablet Take  1 tablet (40 mg total) by mouth daily. 90 tablet 0  . tizanidine (ZANAFLEX) 2 MG capsule Take 1-2 capsules (2-4 mg total) by mouth 3 (three) times daily as needed (Recurrent Mirgraine HA). 60 capsule 1  . traZODone (DESYREL) 50 MG tablet Take 0.5-1 tablets (25-50 mg total) by mouth at bedtime as needed for sleep. 90 tablet 0  . UBRELVY 50 MG TABS Take 1 tablet by mouth as directed. #10/30. Take 1  tab at onset of migraine.  May repeat in 2 hrs, if needed.  Max dose: 2 tabs/day. This is a 30 day prescription.     No current facility-administered medications for this visit.    PAST MEDICAL HISTORY: Past Medical History:  Diagnosis Date  . ADHD   . Allergic rhinitis   . Asthma   . Celiac disease   . GERD (gastroesophageal reflux disease)   . Headache   . Type 1 diabetes mellitus ([name])     PAST SURGICAL HISTORY: Past Surgical History:  Procedure Laterality Date  . DENTAL SURGERY      FAMILY HISTORY: Family History  Problem Relation Age of Onset  . Diabetes Mellitus I Mother   . Thyroid disease Mother   . Depression Mother   . Asthma Mother   . Arthritis Mother   . Arthritis Maternal Grandmother   . Cancer Maternal Grandmother   . Heart disease Maternal Grandmother   . Hypertension Maternal Grandmother   . Arthritis Maternal Grandfather   . Cancer Maternal Grandfather   . Heart disease Maternal Grandfather   . Hypertension Maternal Grandfather   . Stroke Maternal Grandfather   . Hypertension Father     SOCIAL HISTORY: Social History   Socioeconomic History  . Marital status: Single    Spouse name: Not on file  . Number of children: 0  . Years of education: 04/30/20 - current college student  . Highest education level: Not on file  Occupational History  . Occupation: Electronics engineer  Tobacco Use  . Smoking status: Passive Smoke Exposure - Never Smoker  . Smokeless tobacco: Never Used  Substance and Sexual Activity  . Alcohol use: Never  . Drug use: Never  . Sexual activity: Not on file  Other Topics Concern  . Not on file  Social History Narrative   Lives at home with her mother.   Right-handed.   No daily use of caffeine.   Social Determinants of Health   Financial Resource Strain: Not on file  Food Insecurity: Not on file  Transportation Needs: Not on file  Physical Activity: Not on file  Stress: Not on file  Social Connections: Not on file   Intimate Partner Violence: Not on file     PHYSICAL EXAM   Vitals:   07/13/20 0739  BP: (!) 138/93  Pulse: (!) 117  Weight: 118 lb (53.5 kg)  Height: 5' 2"  (1.575 m)   Not recorded     Body mass index is 21.58 kg/m.  PHYSICAL EXAMNIATION:  Gen: NAD, conversant, well nourised, well groomed                NEUROLOGICAL EXAM:  MENTAL STATUS: Speech/cognition: Awake, alert, oriented to history taking and casual conversation   CRANIAL NERVES: CN II: Visual fields are full to confrontation. Pupils are round equal and briskly reactive to light. CN III, IV, VI: extraocular movement are normal. No ptosis. CN V: Facial sensation is intact to light touch CN VII: Face is symmetric with normal eye closure  CN VIII: Hearing is normal to causal conversation. CN IX, X: Phonation is normal. CN XI: Head turning and shoulder shrug are intact  MOTOR: There is no pronator drift of out-stretched arms. Muscle bulk and tone are normal. Muscle strength is normal.  REFLEXES: Reflexes are 2+ and symmetric at the biceps, triceps, knees, and ankles. Plantar responses are flexor.  SENSORY: Intact to light touch, pinprick and vibratory sensation are intact in fingers and toes.  COORDINATION: There is no trunk or limb dysmetria noted.  GAIT/STANCE: Posture is normal. Gait is steady with normal steps, base, arm swing, and turning. Heel and toe walking are normal. Tandem gait is normal.  Romberg is absent.   DIAGNOSTIC DATA (LABS, IMAGING, TESTING) - I reviewed patient records, labs, notes, testing and imaging myself where available.   ASSESSMENT AND PLAN  Samantha Bernard is a 19 y.o. female   Chronic migraine headaches  Titrating up preventive medication nortriptyline up to 50 mg every night  Ubrelvy as needed  May combine it with Aleve, tizanidine, Benadryl for more prolonged severe headaches  Other preventive medication choices are Effexor, Topamax, CGRP antagonist  Return to  clinic with Judson Roch in 3 months  Marcial Pacas, M.D. Ph.D.  [facility] [address], [place], [place] [address] Ph: [phone] Fax: [phone]  CC:  Nicolette Bang, DO [address] [place],  [place] [address]

## 2020-07-18 ENCOUNTER — Other Ambulatory Visit: Payer: Self-pay | Admitting: Internal Medicine

## 2020-07-18 DIAGNOSIS — G4700 Insomnia, unspecified: Secondary | ICD-10-CM

## 2020-07-20 ENCOUNTER — Other Ambulatory Visit: Payer: Self-pay | Admitting: Neurology

## 2020-09-14 ENCOUNTER — Other Ambulatory Visit: Payer: Self-pay | Admitting: Internal Medicine

## 2020-09-14 DIAGNOSIS — G4700 Insomnia, unspecified: Secondary | ICD-10-CM

## 2020-09-16 NOTE — Telephone Encounter (Signed)
Last appt. 03/2020.

## 2020-09-16 NOTE — Telephone Encounter (Signed)
Per patient request courtesy refill Trazodone. Please schedule an office visit with primary provider for additional refills.

## 2020-09-28 ENCOUNTER — Other Ambulatory Visit: Payer: Self-pay | Admitting: Internal Medicine

## 2020-09-28 DIAGNOSIS — J4520 Mild intermittent asthma, uncomplicated: Secondary | ICD-10-CM

## 2020-10-05 ENCOUNTER — Other Ambulatory Visit: Payer: Self-pay | Admitting: Internal Medicine

## 2020-10-05 DIAGNOSIS — G43809 Other migraine, not intractable, without status migrainosus: Secondary | ICD-10-CM

## 2020-10-05 DIAGNOSIS — R112 Nausea with vomiting, unspecified: Secondary | ICD-10-CM

## 2020-10-05 DIAGNOSIS — R1084 Generalized abdominal pain: Secondary | ICD-10-CM

## 2020-10-09 ENCOUNTER — Other Ambulatory Visit: Payer: Self-pay

## 2020-10-12 ENCOUNTER — Ambulatory Visit: Payer: Commercial Managed Care - PPO | Admitting: Neurology

## 2020-10-12 ENCOUNTER — Encounter: Payer: Self-pay | Admitting: Neurology

## 2020-10-12 NOTE — Progress Notes (Deleted)
No chief complaint on file.   HISTORICAL  Samantha Bernard is a 20 years old female, seen in request by her primary care physician Dr. Juleen China, [facility] for evaluation of chronic migraine headaches, initial evaluation was on April 30, 2020  I reviewed and summarized the referring note.  Past medical history Type 1 diabetes, Celiac disease Depression  She reported a history of migraine headaches since 20 years old, used to happen once or twice each months, but had a persistent headache since mid September 2021, going on third week, over the past 3 weeks, she has tried Fioricet, tizanidine, Zofran, without helping her symptoms, previously tried Imitrex complains of itchy throat, make her headache worse, does not want to try another triptan again  She describes her headache as lateralized pressure headaches, few times a week it would exacerbated to a much more severe pounding headache, with light noise sensitivity, movement made it worse, sleep usually help  She was giving a prescription of [name], but has not tried it yet  She denies lateralized motor or sensory deficit,  UPDATE Jul 13 2020: She is overall doing well, ubrevyl is effective as preventive medications, will help migraine in 1 hour, she is taking nortriptyline 10 mg 2 tablets every night, tolerating the medication well, no side effects, she reported mild to moderately decreased frequency of migraine,  Update October 12, 2020 SS:   REVIEW OF SYSTEMS: Full 14 system review of systems performed and notable only for as above All other review of systems were negative.  ALLERGIES: Allergies  Allergen Reactions  . Imitrex [Sumatriptan]     Inability to swallow and sensation of throat swallowing    HOME MEDICATIONS: Current Outpatient Medications  Medication Sig Dispense Refill  . albuterol (VENTOLIN HFA) 108 (90 Base) MCG/ACT inhaler INHALE 2 PUFFS INTO THE LUNGS EVERY 4 (FOUR) HOURS AS NEEDED FOR WHEEZING OR  SHORTNESS OF BREATH. 18 g 0  . butalbital-acetaminophen-caffeine (FIORICET) 50-325-40 MG tablet Take 1-2 tablets by mouth every 6 (six) hours as needed for headache. 20 tablet 0  . Continuous Blood Gluc Receiver ([place]) DEVI 1 each by Misc.(Non-Drug; Combo Route) route daily.    . Continuous Blood Gluc Sensor (DEXCOM G6 SENSOR) MISC 1 each by Misc.(Non-Drug; Combo Route) route every 10 days.    . Continuous Blood Gluc Transmit (DEXCOM G6 TRANSMITTER) MISC 1 each by Misc.(Non-Drug; Combo Route) route every 3 months.    . injection device for insulin (INPEN 100-BLUE-LILLY) DEVI Use as directed to administer insulin 4 times daily    . insulin degludec (TRESIBA FLEXTOUCH) 100 UNIT/ML FlexTouch Pen Inject 43 Units into the skin daily.    . insulin lispro (HUMALOG) 100 UNIT/ML cartridge Use to inject up to 50 units daily and as needed    . metoCLOPramide (REGLAN) 10 MG tablet Take 1 tablet (10 mg total) by mouth every 8 (eight) hours as needed (Recurrent migraine HA). 60 tablet 1  . norelgestromin-ethinyl estradiol [name]) 150-35 MCG/24HR transdermal patch Place 1 patch onto the skin once a week.    . nortriptyline (PAMELOR) 25 MG capsule TAKE 2 CAPSULES (50 MG TOTAL) BY MOUTH AT BEDTIME. 180 capsule 3  . ondansetron (ZOFRAN) 4 MG tablet Take 1 tablet (4 mg total) by mouth every 8 (eight) hours as needed for nausea or vomiting. 30 tablet 1  . pantoprazole (PROTONIX) 40 MG tablet Take 1 tablet (40 mg total) by mouth daily. 90 tablet 0  . tizanidine (ZANAFLEX) 2 MG capsule Take 1-2  capsules (2-4 mg total) by mouth 3 (three) times daily as needed (Recurrent Mirgraine HA). 60 capsule 1  . traZODone (DESYREL) 50 MG tablet TAKE 0.5-1 TABLETS (25-50 MG TOTAL) BY MOUTH AT BEDTIME AS NEEDED FOR SLEEP. 30 tablet 0  . UBRELVY 50 MG TABS Take 1 tablet by mouth as directed. #10/30. Take 1 tab at onset of migraine.  May repeat in 2 hrs, if needed.  Max dose: 2 tabs/day. This is a 30 day prescription. 10  tablet 11   No current facility-administered medications for this visit.    PAST MEDICAL HISTORY: Past Medical History:  Diagnosis Date  . ADHD   . Allergic rhinitis   . Asthma   . Celiac disease   . GERD (gastroesophageal reflux disease)   . Headache   . Type 1 diabetes mellitus ([place])     PAST SURGICAL HISTORY: Past Surgical History:  Procedure Laterality Date  . DENTAL SURGERY      FAMILY HISTORY: Family History  Problem Relation Age of Onset  . Diabetes Mellitus I Mother   . Thyroid disease Mother   . Depression Mother   . Asthma Mother   . Arthritis Mother   . Arthritis Maternal Grandmother   . Cancer Maternal Grandmother   . Heart disease Maternal Grandmother   . Hypertension Maternal Grandmother   . Arthritis Maternal Grandfather   . Cancer Maternal Grandfather   . Heart disease Maternal Grandfather   . Hypertension Maternal Grandfather   . Stroke Maternal Grandfather   . Hypertension Father     SOCIAL HISTORY: Social History   Socioeconomic History  . Marital status: Single    Spouse name: Not on file  . Number of children: 0  . Years of education: 04/30/20 - current college student  . Highest education level: Not on file  Occupational History  . Occupation: Electronics engineer  Tobacco Use  . Smoking status: Passive Smoke Exposure - Never Smoker  . Smokeless tobacco: Never Used  Substance and Sexual Activity  . Alcohol use: Never  . Drug use: Never  . Sexual activity: Not on file  Other Topics Concern  . Not on file  Social History Narrative   Lives at home with her mother.   Right-handed.   No daily use of caffeine.   Social Determinants of Health   Financial Resource Strain: Not on file  Food Insecurity: Not on file  Transportation Needs: Not on file  Physical Activity: Not on file  Stress: Not on file  Social Connections: Not on file  Intimate Partner Violence: Not on file     PHYSICAL EXAM   There were no vitals filed for this  visit. Not recorded     There is no height or weight on file to calculate BMI.  PHYSICAL EXAMNIATION:  Gen: NAD, conversant, well nourised, well groomed                NEUROLOGICAL EXAM:  MENTAL STATUS: Speech/cognition: Awake, alert, oriented to history taking and casual conversation   CRANIAL NERVES: CN II: Visual fields are full to confrontation. Pupils are round equal and briskly reactive to light. CN III, IV, VI: extraocular movement are normal. No ptosis. CN V: Facial sensation is intact to light touch CN VII: Face is symmetric with normal eye closure  CN VIII: Hearing is normal to causal conversation. CN IX, X: Phonation is normal. CN XI: Head turning and shoulder shrug are intact  MOTOR: There is no pronator drift of out-stretched  arms. Muscle bulk and tone are normal. Muscle strength is normal.  REFLEXES: Reflexes are 2+ and symmetric at the biceps, triceps, knees, and ankles. Plantar responses are flexor.  SENSORY: Intact to light touch, pinprick and vibratory sensation are intact in fingers and toes.  COORDINATION: There is no trunk or limb dysmetria noted.  GAIT/STANCE: Posture is normal. Gait is steady with normal steps, base, arm swing, and turning. Heel and toe walking are normal. Tandem gait is normal.  Romberg is absent.   DIAGNOSTIC DATA (LABS, IMAGING, TESTING) - I reviewed patient records, labs, notes, testing and imaging myself where available.   ASSESSMENT AND PLAN  Mikayela Deats is a 20 y.o. female   Chronic migraine headaches  Titrating up preventive medication nortriptyline up to 50 mg every night  Ubrelvy as needed  May combine it with Aleve, tizanidine, Benadryl for more prolonged severe headaches  Other preventive medication choices are Effexor, Topamax, CGRP antagonist  Return to clinic with Judson Roch in 3 months  Marcial Pacas, M.D. Ph.D.  [facility] [address] [place], [place] [address] Ph: [phone] Fax: [phone]  CC:  Nicolette Bang, DO [address] [place],  [place] [address]

## 2020-10-20 ENCOUNTER — Other Ambulatory Visit: Payer: Self-pay | Admitting: Internal Medicine

## 2020-10-20 DIAGNOSIS — J4520 Mild intermittent asthma, uncomplicated: Secondary | ICD-10-CM

## 2020-11-07 ENCOUNTER — Other Ambulatory Visit: Payer: Self-pay | Admitting: Internal Medicine

## 2020-11-07 DIAGNOSIS — J4520 Mild intermittent asthma, uncomplicated: Secondary | ICD-10-CM

## 2020-12-06 ENCOUNTER — Other Ambulatory Visit: Payer: Self-pay | Admitting: Internal Medicine

## 2020-12-06 DIAGNOSIS — J4520 Mild intermittent asthma, uncomplicated: Secondary | ICD-10-CM

## 2020-12-22 ENCOUNTER — Other Ambulatory Visit: Payer: Self-pay | Admitting: Family

## 2020-12-22 DIAGNOSIS — G4700 Insomnia, unspecified: Secondary | ICD-10-CM
